# Patient Record
Sex: Female | Born: 1965 | Race: White | Hispanic: No | Marital: Married | Smoking: Never smoker
Health system: Southern US, Community
[De-identification: ages and names within clinical notes are randomized; demographics above are authoritative.]

## PROBLEM LIST (undated history)

## (undated) ENCOUNTER — Emergency Department (HOSPITAL_COMMUNITY): Payer: Medicare HMO | Source: Home / Self Care

## (undated) DIAGNOSIS — G4730 Sleep apnea, unspecified: Secondary | ICD-10-CM

## (undated) DIAGNOSIS — E538 Deficiency of other specified B group vitamins: Secondary | ICD-10-CM

## (undated) DIAGNOSIS — N62 Hypertrophy of breast: Secondary | ICD-10-CM

## (undated) DIAGNOSIS — M797 Fibromyalgia: Secondary | ICD-10-CM

## (undated) DIAGNOSIS — G8929 Other chronic pain: Secondary | ICD-10-CM

## (undated) DIAGNOSIS — R0902 Hypoxemia: Secondary | ICD-10-CM

## (undated) DIAGNOSIS — M67432 Ganglion, left wrist: Secondary | ICD-10-CM

## (undated) DIAGNOSIS — E1143 Type 2 diabetes mellitus with diabetic autonomic (poly)neuropathy: Secondary | ICD-10-CM

## (undated) DIAGNOSIS — H409 Unspecified glaucoma: Secondary | ICD-10-CM

## (undated) DIAGNOSIS — K3184 Gastroparesis: Secondary | ICD-10-CM

## (undated) DIAGNOSIS — E119 Type 2 diabetes mellitus without complications: Secondary | ICD-10-CM

## (undated) DIAGNOSIS — F32A Depression, unspecified: Secondary | ICD-10-CM

## (undated) DIAGNOSIS — R52 Pain, unspecified: Secondary | ICD-10-CM

## (undated) DIAGNOSIS — J45909 Unspecified asthma, uncomplicated: Secondary | ICD-10-CM

## (undated) DIAGNOSIS — K589 Irritable bowel syndrome without diarrhea: Secondary | ICD-10-CM

## (undated) DIAGNOSIS — G44309 Post-traumatic headache, unspecified, not intractable: Secondary | ICD-10-CM

## (undated) DIAGNOSIS — G43909 Migraine, unspecified, not intractable, without status migrainosus: Secondary | ICD-10-CM

## (undated) DIAGNOSIS — C4490 Unspecified malignant neoplasm of skin, unspecified: Secondary | ICD-10-CM

## (undated) DIAGNOSIS — I2510 Atherosclerotic heart disease of native coronary artery without angina pectoris: Secondary | ICD-10-CM

## (undated) DIAGNOSIS — S0990XS Unspecified injury of head, sequela: Secondary | ICD-10-CM

## (undated) DIAGNOSIS — J309 Allergic rhinitis, unspecified: Secondary | ICD-10-CM

## (undated) DIAGNOSIS — F329 Major depressive disorder, single episode, unspecified: Secondary | ICD-10-CM

## (undated) DIAGNOSIS — M7542 Impingement syndrome of left shoulder: Secondary | ICD-10-CM

## (undated) DIAGNOSIS — E785 Hyperlipidemia, unspecified: Secondary | ICD-10-CM

## (undated) DIAGNOSIS — M5136 Other intervertebral disc degeneration, lumbar region: Secondary | ICD-10-CM

## (undated) DIAGNOSIS — Z9889 Other specified postprocedural states: Secondary | ICD-10-CM

## (undated) DIAGNOSIS — N200 Calculus of kidney: Secondary | ICD-10-CM

## (undated) HISTORY — DX: Other intervertebral disc degeneration, lumbar region: M51.36

## (undated) HISTORY — DX: Unspecified asthma, uncomplicated: J45.909

## (undated) HISTORY — DX: Other specified postprocedural states: Z98.890

## (undated) HISTORY — DX: Impingement syndrome of left shoulder: M75.42

## (undated) HISTORY — PX: BREAST REDUCTION SURGERY: SHX8

## (undated) HISTORY — DX: Calculus of kidney: N20.0

## (undated) HISTORY — DX: Pain, unspecified: R52

## (undated) HISTORY — PX: ELBOW SURGERY: SHX618

## (undated) HISTORY — DX: Sleep apnea, unspecified: G47.30

## (undated) HISTORY — DX: Fibromyalgia: M79.7

## (undated) HISTORY — DX: Atherosclerotic heart disease of native coronary artery without angina pectoris: I25.10

## (undated) HISTORY — PX: GANGLION CYST EXCISION: SHX1691

## (undated) HISTORY — DX: Hypoxemia: R09.02

## (undated) HISTORY — DX: Deficiency of other specified B group vitamins: E53.8

## (undated) HISTORY — DX: Post-traumatic headache, unspecified, not intractable: S09.90XS

## (undated) HISTORY — PX: ORIF ANKLE FRACTURE: SUR919

## (undated) HISTORY — DX: Hypertrophy of breast: N62

## (undated) HISTORY — DX: Type 2 diabetes mellitus without complications: E11.9

## (undated) HISTORY — DX: Gastroparesis: E11.43

## (undated) HISTORY — DX: Irritable bowel syndrome without diarrhea: K58.9

## (undated) HISTORY — DX: Hyperlipidemia, unspecified: E78.5

## (undated) HISTORY — DX: Gastroparesis: K31.84

## (undated) HISTORY — DX: Unspecified malignant neoplasm of skin, unspecified: C44.90

## (undated) HISTORY — PX: ROTATOR CUFF REPAIR: SHX139

## (undated) HISTORY — DX: Allergic rhinitis, unspecified: J30.9

## (undated) HISTORY — PX: CHOLECYSTECTOMY: SHX55

## (undated) HISTORY — DX: Migraine, unspecified, not intractable, without status migrainosus: G43.909

## (undated) HISTORY — DX: Major depressive disorder, single episode, unspecified: F32.9

## (undated) HISTORY — DX: Unspecified glaucoma: H40.9

## (undated) HISTORY — DX: Post-traumatic headache, unspecified, not intractable: G44.309

## (undated) HISTORY — DX: Other chronic pain: G89.29

## (undated) HISTORY — DX: Depression, unspecified: F32.A

## (undated) HISTORY — DX: Ganglion, left wrist: M67.432

---

## 1998-04-24 ENCOUNTER — Ambulatory Visit (HOSPITAL_COMMUNITY): Admission: RE | Admit: 1998-04-24 | Discharge: 1998-04-24 | Payer: Self-pay | Admitting: Family Medicine

## 1999-08-07 ENCOUNTER — Ambulatory Visit (HOSPITAL_COMMUNITY): Admission: RE | Admit: 1999-08-07 | Discharge: 1999-08-07 | Payer: Self-pay | Admitting: Obstetrics and Gynecology

## 2000-03-15 ENCOUNTER — Inpatient Hospital Stay (HOSPITAL_COMMUNITY): Admission: RE | Admit: 2000-03-15 | Discharge: 2000-03-17 | Payer: Self-pay | Admitting: Obstetrics and Gynecology

## 2018-03-29 ENCOUNTER — Encounter: Payer: Self-pay | Admitting: Cardiology

## 2018-04-20 ENCOUNTER — Ambulatory Visit: Payer: Self-pay | Admitting: Cardiology

## 2018-05-25 ENCOUNTER — Encounter: Payer: Self-pay | Admitting: Cardiology

## 2018-05-25 ENCOUNTER — Encounter (INDEPENDENT_AMBULATORY_CARE_PROVIDER_SITE_OTHER): Payer: Self-pay

## 2018-05-25 ENCOUNTER — Ambulatory Visit: Payer: Medicare HMO | Admitting: Cardiology

## 2018-05-25 VITALS — BP 140/86 | HR 89 | Ht 60.0 in | Wt 182.0 lb

## 2018-05-25 DIAGNOSIS — K3184 Gastroparesis: Secondary | ICD-10-CM | POA: Diagnosis not present

## 2018-05-25 DIAGNOSIS — E785 Hyperlipidemia, unspecified: Secondary | ICD-10-CM

## 2018-05-25 DIAGNOSIS — R9431 Abnormal electrocardiogram [ECG] [EKG]: Secondary | ICD-10-CM

## 2018-05-25 DIAGNOSIS — E1169 Type 2 diabetes mellitus with other specified complication: Secondary | ICD-10-CM

## 2018-05-25 DIAGNOSIS — R0789 Other chest pain: Secondary | ICD-10-CM

## 2018-05-25 DIAGNOSIS — E1143 Type 2 diabetes mellitus with diabetic autonomic (poly)neuropathy: Secondary | ICD-10-CM

## 2018-05-25 DIAGNOSIS — R079 Chest pain, unspecified: Secondary | ICD-10-CM | POA: Diagnosis not present

## 2018-05-25 NOTE — Patient Instructions (Addendum)
Medication Instructions:  Your physician recommends that you continue on your current medications as directed. Please refer to the Current Medication list given to you today.   Labwork: none  Testing/Procedures: See instructions below.   Follow-Up: Your physician recommends that you schedule a follow-up appointment in: 3 months with Dr. Ellyn Hack.   Any Other Special Instructions Will Be Listed Below (If Applicable).  Please arrive at the [facility] main entrance of [facility] at ______________ (30-45 minutes prior to test start time)  [facility] [place], [place] [address] [phone]  Proceed to the [facility] Radiology Department (First Floor).  Please follow these instructions carefully (unless otherwise directed):  On the Night Before the Test: . Drink plenty of water. . Do not consume any caffeinated/decaffeinated beverages or chocolate 12 hours prior to your test. . Do not take any antihistamines 12 hours prior to your test. . If you take Metformin do not take 24 hours prior to test AND do not take 48 hours after complete the test.  On the Day of the Test: . Drink plenty of water. Do not drink any water within one hour of the test. . Do not eat any food 4 hours prior to the test. . You may take your regular medications prior to the test.  After the Test: . Drink plenty of water. . After receiving IV contrast, you may experience a mild flushed feeling. This is normal. . On occasion, you may experience a mild rash up to 24 hours after the test. This is not dangerous. If this occurs, you can take Benadryl 25 mg and increase your fluid intake. . If you experience trouble breathing, this can be serious. If it is severe call 911 IMMEDIATELY. If it is mild, please call our office. . If you take any of these medications: Glipizide/Metformin, Avandament, Glucavance, please do not take 48 hours after completing test.    If you need a  refill on your cardiac medications before your next appointment, please call your pharmacy.

## 2018-05-25 NOTE — Progress Notes (Signed)
PCP: Octavio Graves, DO  Clinic Note: Chief Complaint  Patient presents with  . New Patient (Initial Visit)    Chest pain, 2 episodes.  One took her to the emergency room    HPI: Brandy Cooke is a 52 y.o. female who is being seen today for the evaluation of CHEST PAIN at the request of  Octavio Graves, DO in response to an emergency room visit on April 29, 2018. Brandy Cooke has a long-standing history of diabetes mellitus, type II (with gastroparesis and neuropathy), hypertension, and hyperlipidemia.  She is not currently on any blood pressure medicines, and is only on low-dose Mevacor for hyperlipidemia with relatively good control. She has a history of reported cardiac arrest associated with intubation for surgery back in October 2016.  Brandy Cooke was last seen by an internal medicine physician back in January 2019.  Apparently she has been dealing with a lot of epigastric and GI issues probably related to gastroparesis.  She is however been evaluated with a PE protocol CT scan in January and June of this year.  There is evidence of coronary calcification noted on CT scan.  Recent Hospitalizations:  April 29, 2018: Presented with sharp epigastric abdominal pain apparently has been going on for several months.  She felt as it was a "tear in the right side of her abdomen ".  (To me she points more left-sided).  She said that the discomfort radiated to the substernal chest area.  She also noted alternating diarrhea and constipation is a chronic symptom.  Back in January she was told that she had a right-sided diaphragmatic hernia (was very scared because she was told that if it pushes against her heart it could cause sudden death).  On the day of presentation, she had been more active than usual. -->  She was diagnosed with epigastric pain related to constipation and pleurodynia.  Studies Personally Reviewed - (if available, images/films reviewed: From Epic Chart or Care Everywhere)  PE  protocol CT April 29, 2018: Right lung base atelectasis versus scarring).  No acute thoracic aortic abnormalities.  (From January study  -atherosclerosis of multiple arterial structures including coronary arteries)  Interval History: Brandy Cooke presents here today the notably in if not physical distress and emotional distress.  She is very tearful and concerned.  She tells me she has had 2 episodes of chest pain that are concerning for her. 1. While riding in her car she had an episode of 35 minutes of pain (couple weeks ago).  She went to the fire department and was told she had normal EKG and her symptoms improved somewhat so she did not go to emergency room. 2. She was at the store near her house talking to friends and she started having sharp more epigastric type pain radiating up into her chest.  That is what led to her going to the emergency room and having the evaluation noted above. She tells me that the chest pain episodes have happened with both rest or with doing mild exertion.  Not with fast walking.  She has had several episodes of feeling dizzy but cannot really describe to me symptoms that go along with it.  She really cannot tell me if she feels any irregular heartbeats or flip-flopping skipping beats.  She thinks she might but not all the time. As far as the chest pain episodes that she has been having now they do not necessarily seem to be exertional although taking a deep breath and her  coughing makes them worse.  She points to her epigastric area and saying that it goes up into her chest.  She also has diffuse abdominal discomfort.  Very hard to get any good story from her.  She says that she walks her dog daily and does not have worsening of this chest discomfort walking the dog.  May affect she may always have a little bit of discomfort in her chest but the symptoms that she had noted above were different for her.  With her walking, she denies any chest discomfort.  She sometimes gets short  of breath with exercise, but not routinely.  She does not really do much exercise, besides walking her her dog around the neighborhood.  no real complaints of PND, orthopnea or edema.  She feels fluttering but no rapid irregular heartbeats or palpitations.  No syncope/near syncope or TIA/amaurosis fugax.  No claudication.  ROS: A comprehensive was performed and to the extent was performed was positive. Review of Systems  Constitutional: Positive for chills and malaise/fatigue. Negative for diaphoresis.  HENT: Negative for congestion, nosebleeds and sore throat.   Eyes: Positive for blurred vision.  Respiratory: Positive for cough and shortness of breath. Negative for wheezing.   Cardiovascular: Negative for leg swelling (Minimal).  Gastrointestinal: Positive for abdominal pain, constipation, diarrhea, heartburn and nausea. Negative for blood in stool and melena.  Genitourinary: Negative for hematuria.  Musculoskeletal: Positive for back pain, joint pain (Knees and hips) and myalgias (Fibromyalgia). Negative for falls.  Neurological: Positive for dizziness, weakness (Global) and headaches.  Psychiatric/Behavioral: Positive for depression (Supposedly under control). The patient is nervous/anxious.   All other systems reviewed and are negative.    I have reviewed and (if needed) personally updated the patient's problem list, medications, allergies, past medical and surgical history, social and family history.   Past Medical History:  Diagnosis Date  . Allergic rhinitis   . Asthma   . Chronic depression    Dr. Annette Stable Psychiatry     . Chronic pain of multiple sites   . DDD (degenerative disc disease), lumbar   . Diabetic gastroparesis associated with type 2 diabetes mellitus ([place])   . Dyslipidemia    Well-controlled with low-dose Mevacor  . Fibromyalgia   . Ganglion cyst of dorsum of left wrist   . Glaucoma   . Headaches due to old head injury   . IBS (irritable bowel syndrome)   .  Impingement syndrome of left shoulder   . Macromastia   . Migraine   . Nephrolithiasis   . Postoperative hypoxia   . Skin cancer   . Sleep apnea   . Type 2 diabetes mellitus without complication, without long-term current use of insulin ([place])   . Vitamin B12 deficiency   --Gastroparesis from diabetes  Past Surgical History:  Procedure Laterality Date  . BREAST REDUCTION SURGERY    . CHOLECYSTECTOMY    . ELBOW SURGERY     X4  . GANGLION CYST EXCISION Left    WRIST  . ORIF ANKLE FRACTURE Right   . ROTATOR CUFF REPAIR     RIGHT AND LEFT    Current Meds  Medication Sig  . acyclovir (ZOVIRAX) 400 MG tablet Take 400 mg by mouth 2 (two) times daily.  Marland Kitchen ALPRAZolam (XANAX) 1 MG tablet Take 1 mg by mouth daily. TAKE 1 TABLET IN AM AND 2 TABLETS AT BEDTIME  . cyclobenzaprine (FLEXERIL) 10 MG tablet Take 10 mg by mouth 2 (two) times daily as needed for  muscle spasms.  Marland Kitchen glimepiride (AMARYL) 2 MG tablet Take 2 mg by mouth 2 (two) times daily. WITH BREAKFAST OR THE FIRST MEAL OF THE DAY  . hydrochlorothiazide (HYDRODIURIL) 12.5 MG tablet Take 12.5 mg by mouth daily.  . hyoscyamine (LEVSIN, ANASPAZ) 0.125 MG tablet Take 0.125 mg by mouth every 4 (four) hours as needed.  . lovastatin (MEVACOR) 20 MG tablet Take 20 mg by mouth daily at 6 PM. WITH EVENING MEAL  . metFORMIN (GLUMETZA) 1000 MG (MOD) 24 hr tablet Take 1,000 mg by mouth 2 (two) times daily with a meal. EVENING  . omeprazole (PRILOSEC) 40 MG capsule Take 40 mg by mouth 2 (two) times daily.  . Probiotic Product (PROBIOTIC DAILY) CAPS Take by mouth.  . traZODone (DESYREL) 100 MG tablet Take 200 mg by mouth at bedtime.  . triamcinolone cream (KENALOG) 0.1 % Apply 1 application topically 2 (two) times daily.    Allergies  Allergen Reactions  . Depakote [Divalproex Sodium]   . Gabapentin   . Penicillins   . Poison [employer]   . Sulfa Antibiotics     Social History   Tobacco Use  . Smoking status: Never Smoker  . Smokeless  tobacco: Never Used  Substance Use Topics  . Alcohol use: Never    Frequency: Never  . Drug use: Never   Social History   Social History Narrative  . Not on file    family history includes Diabetes Mellitus I in her sister; Diabetes Mellitus II (age of onset: 18) in her father.  Wt Readings from Last 3 Encounters:  05/25/18 182 lb (82.6 kg)    PHYSICAL EXAM BP 140/86 (BP Location: Left Arm, Patient Position: Supine, Cuff Size: Normal)   Pulse 89   Ht 5' (1.524 m)   Wt 182 lb (82.6 kg)   BMI 35.54 kg/m  Physical Exam  Constitutional: She is oriented to person, place, and time. She appears well-developed and well-nourished. She appears distressed (More emotional than physical).  Well-nourished well-groomed.  Moderately obese.  Seems to be in emotional distress more so than anything else.  Nontoxic.  HENT:  Head: Normocephalic and atraumatic.  Mouth/Throat: No oropharyngeal exudate.  Tearful  Eyes: Pupils are equal, round, and reactive to light. Conjunctivae and EOM are normal. No scleral icterus.  Neck: Normal range of motion. Neck supple. No hepatojugular reflux and no JVD present. Carotid bruit is not present.  Cardiovascular: Normal rate, regular rhythm, intact distal pulses and normal pulses.  Extrasystoles are present. PMI is not displaced (Unable to palpate). Exam reveals distant heart sounds. Exam reveals no gallop and no friction rub.  No murmur heard. Pulmonary/Chest: No respiratory distress. She has no wheezes. She has no rales. She exhibits tenderness (Tenderness along the lower sternal border into the epigastrium.).  Diminished basal breath sounds.  Possibly due to poor effort.  Abdominal: She exhibits distension. She exhibits no mass. There is tenderness. There is guarding.  Difficult to tell if there is true distention or just fullness.  Does appear to be mildly distended and tympanitic.  Exquisite tenderness in the epigastric region radiating from right upper  quadrant to left upper quadrant.  No positive Murphy or McBurney sign.  No rebound. She was in tears when I was examining her abdomen  Musculoskeletal: Normal range of motion. She exhibits edema (Trivial bilateral pedal).  Lymphadenopathy:    She has no cervical adenopathy.  Neurological: She is alert and oriented to person, place, and time. No cranial nerve deficit.  Coordination normal.  Skin: Skin is warm and dry.  Psychiatric: Her speech is normal. Thought content normal. Her mood appears anxious. Her affect is labile. She is slowed. She exhibits a depressed mood. She exhibits abnormal recent memory.  Very labile, tearful especially after physical exam complete.  Vitals reviewed.   Adult ECG Report  Rate: 89;  Rhythm: normal sinus rhythm and Cannot exclude inferior and anterior MI age undetermined.  Normal axis, intervals and durations.;   Narrative Interpretation: Besides poor R wave progression and borderline cues in inferior leads, normal.   Other studies Reviewed: Additional studies/ records that were reviewed today include:  Recent Labs: From Care Everywhere  January 2019 A1c 7.9  April 29, 2018: CBC-W 10.2, H/H 12.5/39.5, platelet 259. Na+ 137, K+ 3.7, Cl- 98, HCO3- 26, BUN 8, Cr 0.6, Glu 91, Ca++ 8.9; AST  17, ALT 22, AlkP 91  February 28, 2018: TSH 2.3, free T4 1.04.  August 30, 2017: TC 159, TG 324, HDL 29, LDL 65   ASSESSMENT / PLAN: Problem List Items Addressed This Visit    Hyperlipidemia associated with type 2 diabetes mellitus ([place])    She is on low-dose lovastatin (we can stop statins) with relatively well-controlled lipid levels.  Triglycerides are little elevated, but otherwise LDL and total cholesterol look good.  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  She seems to be confused as well is being done and seems to perseverate on the abdominal symptoms..  I suspect that a lot of the abdominal pain and epigastric pain she is feeling is still related to this.  However we will exclude CAD since she has diabetes.      Relevant Orders   EKG 12-Lead (Completed)   CT CORONARY MORPH W/CTA COR W/SCORE W/CA W/CM &/OR WO/CM   CT CORONARY FRACTIONAL FLOW RESERVE DATA PREP   CT CORONARY FRACTIONAL FLOW RESERVE FLUID ANALYSIS   Basic metabolic panel   Chest wall pain    She definitely has palpable pain in her epigastric area as well as along the rib margins.  She actually was tearful when I palpated.  My suspicion is that she is having some musculoskeletal symptoms of costochondritis and that may be what was leading to her symptoms noted in HPI.  We will exclude ischemic CAD first.      Relevant Orders   EKG 12-Lead (Completed)   CT CORONARY MORPH W/CTA COR W/SCORE W/CA W/CM &/OR WO/CM   CT CORONARY FRACTIONAL FLOW RESERVE DATA PREP   CT CORONARY FRACTIONAL FLOW RESERVE FLUID ANALYSIS   Basic metabolic panel   Chest pain with moderate risk for cardiac etiology - Primary    She is a diabetic with low HDL and borderline hypertension along with obesity.  She would therefore have metabolic syndrome which makes her high risk for CAD.  Plan: I do not think she can walk on a treadmill and therefore I do not think a treadmill GXT would be reasonable.  I am leery of doing a Myoview stress test because  of gastroparesis leading to a large gastric bubble and therefore splanchnic attenuation. Best course of action is coronary calcium score with coronary CT angiogram which allows [place] to evaluate the presence of CAD but then also physiologic evaluation of  potential lesions.  Plan: Coronary Calcium Score with CTA (, will need pre-CTA labs)      Relevant Orders   EKG 12-Lead (Completed)   CT CORONARY MORPH W/CTA COR W/SCORE W/CA W/CM &/OR WO/CM   CT CORONARY FRACTIONAL FLOW RESERVE DATA PREP   CT CORONARY FRACTIONAL FLOW RESERVE FLUID ANALYSIS   Basic metabolic panel   Abnormal EKG    EKG has suggestion of both inferior and anterior MI.  She has no reaction of being ever told she had any issues with her heart.  Would be nice to have an ischemic evaluation or recent echocardiogram to review.  We will start with coronary CT angiogram to evaluate for ischemia.  Next step would then be to consider echocardiogram.      Relevant Orders   EKG 12-Lead (Completed)   CT CORONARY MORPH W/CTA COR W/SCORE W/CA W/CM &/OR WO/CM   CT CORONARY FRACTIONAL FLOW RESERVE DATA PREP   CT CORONARY FRACTIONAL FLOW RESERVE FLUID ANALYSIS   Basic metabolic panel       I spent a total of 44minutes with the patient and chart review. >  50% of the time was spent in direct patient consultation.   Current medicines are reviewed at length with the patient today.  (+/- concerns) not sure what to about her Sx. The following changes have been made:  see below, no med changes pending OP evaluation.  Patient Instructions  Medication Instructions:  Your physician recommends that you continue on your current medications as directed. Please refer to the Current Medication list given to you today.   Labwork: none  Testing/Procedures: See instructions below.   Follow-Up: Your physician recommends that you schedule a follow-up appointment in: 3 months with Dr. Ellyn Hack.    Studies Ordered:   Orders Placed This Encounter  Procedures  . CT CORONARY MORPH W/CTA COR W/SCORE W/CA W/CM &/OR WO/CM  . CT CORONARY FRACTIONAL FLOW RESERVE DATA PREP  . CT CORONARY FRACTIONAL FLOW RESERVE FLUID ANALYSIS  . Basic metabolic panel  . EKG 12-Lead      Glenetta Hew,  M.D., M.S. Interventional Cardiologist   Pager # [phone] Phone # [phone] [address] [place], [place] [address]   Thank you for choosing [facility] at [facility]!!

## 2018-05-27 ENCOUNTER — Encounter: Payer: Self-pay | Admitting: Cardiology

## 2018-05-27 DIAGNOSIS — K3184 Gastroparesis: Secondary | ICD-10-CM

## 2018-05-27 DIAGNOSIS — E1143 Type 2 diabetes mellitus with diabetic autonomic (poly)neuropathy: Secondary | ICD-10-CM | POA: Insufficient documentation

## 2018-05-27 DIAGNOSIS — R0789 Other chest pain: Secondary | ICD-10-CM | POA: Insufficient documentation

## 2018-05-27 DIAGNOSIS — I2510 Atherosclerotic heart disease of native coronary artery without angina pectoris: Secondary | ICD-10-CM | POA: Insufficient documentation

## 2018-05-27 DIAGNOSIS — R9431 Abnormal electrocardiogram [ECG] [EKG]: Secondary | ICD-10-CM | POA: Insufficient documentation

## 2018-05-27 DIAGNOSIS — E785 Hyperlipidemia, unspecified: Secondary | ICD-10-CM

## 2018-05-27 DIAGNOSIS — E1169 Type 2 diabetes mellitus with other specified complication: Secondary | ICD-10-CM | POA: Insufficient documentation

## 2018-05-27 NOTE — Assessment & Plan Note (Signed)
She definitely has palpable pain in her epigastric area as well as along the rib margins.  She actually was tearful when I palpated.  My suspicion is that she is having some musculoskeletal symptoms of costochondritis and that may be what was leading to her symptoms noted in HPI.  We will exclude ischemic CAD first.

## 2018-05-27 NOTE — Assessment & Plan Note (Signed)
She is on low-dose lovastatin (we can stop statins) with relatively well-controlled lipid levels.  Triglycerides are little elevated, but otherwise LDL and total cholesterol look good.  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

## 2018-05-27 NOTE — Assessment & Plan Note (Addendum)
EKG has suggestion of both inferior and anterior MI.  She has no reaction of being ever told she had any issues with her heart.  Would be nice to have an ischemic evaluation or recent echocardiogram to review.  We will start with coronary CT angiogram to evaluate for ischemia.  Next step would then be to consider echocardiogram.

## 2018-05-27 NOTE — Assessment & Plan Note (Signed)
She is a diabetic with low HDL and borderline hypertension along with obesity.  She would therefore have metabolic syndrome which makes her high risk for CAD.  Plan: I do not think she can walk on a treadmill and therefore I do not think a treadmill GXT would be reasonable.  I am leery of doing a Myoview stress test because of gastroparesis leading to a large gastric bubble and therefore splanchnic attenuation. Best course of action is coronary calcium score with coronary CT angiogram which allows [place] to evaluate the presence of CAD but then also physiologic evaluation of potential lesions.  Plan: Coronary Calcium Score with CTA (, will need pre-CTA labs)

## 2018-05-27 NOTE — Assessment & Plan Note (Signed)
This seems to be more the major driving forces for a lot of her abdominal and epigastric symptoms.  She seems to be confused as well is being done and seems to perseverate on the abdominal symptoms..  I suspect that a lot of the abdominal pain and epigastric pain she is feeling is still related to this.  However we will exclude CAD since she has diabetes.

## 2018-06-09 DIAGNOSIS — I2510 Atherosclerotic heart disease of native coronary artery without angina pectoris: Secondary | ICD-10-CM

## 2018-06-09 HISTORY — DX: Atherosclerotic heart disease of native coronary artery without angina pectoris: I25.10

## 2018-06-15 LAB — BASIC METABOLIC PANEL
BUN/Creatinine Ratio: 18 (ref 9–23)
BUN: 12 mg/dL (ref 6–24)
CALCIUM: 9.2 mg/dL (ref 8.7–10.2)
CHLORIDE: 98 mmol/L (ref 96–106)
CO2: 27 mmol/L (ref 20–29)
Creatinine, Ser: 0.67 mg/dL (ref 0.57–1.00)
GFR calc non Af Amer: 103 mL/min/{1.73_m2} (ref >59)
GFR, EST AFRICAN AMERICAN: 119 mL/min/{1.73_m2} (ref >59)
GLUCOSE: 106 mg/dL — AB (ref 65–99)
POTASSIUM: 4.1 mmol/L (ref 3.5–5.2)
Sodium: 140 mmol/L (ref 134–144)

## 2018-06-22 ENCOUNTER — Ambulatory Visit (HOSPITAL_COMMUNITY)
Admission: RE | Admit: 2018-06-22 | Discharge: 2018-06-22 | Disposition: A | Discharge status: Home / Self Care | Payer: Medicare HMO | Source: Ambulatory Visit | Attending: Cardiology | Admitting: Cardiology

## 2018-06-22 ENCOUNTER — Other Ambulatory Visit: Payer: Self-pay

## 2018-06-22 ENCOUNTER — Emergency Department (HOSPITAL_COMMUNITY)
Admission: EM | Admit: 2018-06-22 | Discharge: 2018-06-22 | Disposition: A | Discharge status: Home / Self Care | Payer: Medicare HMO | Attending: Emergency Medicine | Admitting: Emergency Medicine

## 2018-06-22 ENCOUNTER — Emergency Department (HOSPITAL_COMMUNITY): Payer: Medicare HMO

## 2018-06-22 ENCOUNTER — Encounter (HOSPITAL_COMMUNITY): Payer: Self-pay

## 2018-06-22 DIAGNOSIS — E785 Hyperlipidemia, unspecified: Secondary | ICD-10-CM

## 2018-06-22 DIAGNOSIS — E1143 Type 2 diabetes mellitus with diabetic autonomic (poly)neuropathy: Secondary | ICD-10-CM | POA: Insufficient documentation

## 2018-06-22 DIAGNOSIS — K3184 Gastroparesis: Secondary | ICD-10-CM

## 2018-06-22 DIAGNOSIS — E1169 Type 2 diabetes mellitus with other specified complication: Secondary | ICD-10-CM | POA: Insufficient documentation

## 2018-06-22 DIAGNOSIS — R0789 Other chest pain: Secondary | ICD-10-CM

## 2018-06-22 DIAGNOSIS — R9431 Abnormal electrocardiogram [ECG] [EKG]: Secondary | ICD-10-CM

## 2018-06-22 DIAGNOSIS — R55 Syncope and collapse: Secondary | ICD-10-CM

## 2018-06-22 DIAGNOSIS — I2510 Atherosclerotic heart disease of native coronary artery without angina pectoris: Secondary | ICD-10-CM

## 2018-06-22 DIAGNOSIS — K760 Fatty (change of) liver, not elsewhere classified: Secondary | ICD-10-CM | POA: Insufficient documentation

## 2018-06-22 DIAGNOSIS — J45909 Unspecified asthma, uncomplicated: Secondary | ICD-10-CM | POA: Insufficient documentation

## 2018-06-22 DIAGNOSIS — R072 Precordial pain: Secondary | ICD-10-CM | POA: Diagnosis not present

## 2018-06-22 DIAGNOSIS — R079 Chest pain, unspecified: Secondary | ICD-10-CM

## 2018-06-22 LAB — I-STAT BETA HCG BLOOD, ED (MC, WL, AP ONLY): I-stat hCG, quantitative: <5 m[IU]/mL (ref <5)

## 2018-06-22 LAB — CBC
HEMATOCRIT: 39.2 % (ref 36.0–46.0)
HEMOGLOBIN: 12.3 g/dL (ref 12.0–15.0)
MCH: 28.8 pg (ref 26.0–34.0)
MCHC: 31.4 g/dL (ref 30.0–36.0)
MCV: 91.8 fL (ref 78.0–100.0)
Platelets: 265 10*3/uL (ref 150–400)
RBC: 4.27 MIL/uL (ref 3.87–5.11)
RDW: 13.4 % (ref 11.5–15.5)
WBC: 11 10*3/uL — ABNORMAL HIGH (ref 4.0–10.5)

## 2018-06-22 LAB — BASIC METABOLIC PANEL
Anion gap: 8 (ref 5–15)
BUN: 8 mg/dL (ref 6–20)
CO2: 28 mmol/L (ref 22–32)
Calcium: 8.5 mg/dL — ABNORMAL LOW (ref 8.9–10.3)
Chloride: 101 mmol/L (ref 98–111)
Creatinine, Ser: 0.64 mg/dL (ref 0.44–1.00)
GFR calc Af Amer: >60 mL/min (ref >60)
GFR calc non Af Amer: >60 mL/min (ref >60)
GLUCOSE: 145 mg/dL — AB (ref 70–99)
POTASSIUM: 3.7 mmol/L (ref 3.5–5.1)
Sodium: 137 mmol/L (ref 135–145)

## 2018-06-22 LAB — I-STAT TROPONIN, ED: Troponin i, poc: 0 ng/mL (ref 0.00–0.08)

## 2018-06-22 MED ORDER — METOPROLOL TARTRATE 5 MG/5ML IV SOLN
10.0000 mg | INTRAVENOUS | Status: AC | PRN
  Administered 2018-06-22 (×2): 10 mg via INTRAVENOUS
  Filled 2018-06-22 (×2): qty 10

## 2018-06-22 MED ORDER — NITROGLYCERIN 0.4 MG SL SUBL
SUBLINGUAL_TABLET | SUBLINGUAL | Status: AC
  Filled 2018-06-22: qty 2

## 2018-06-22 MED ORDER — NITROGLYCERIN 0.4 MG SL SUBL
0.8000 mg | SUBLINGUAL_TABLET | Freq: Once | SUBLINGUAL | Status: AC
  Administered 2018-06-22: 0.8 mg via SUBLINGUAL
  Filled 2018-06-22: qty 25

## 2018-06-22 MED ORDER — METOPROLOL TARTRATE 5 MG/5ML IV SOLN
INTRAVENOUS | Status: AC
  Filled 2018-06-22: qty 20

## 2018-06-22 MED ORDER — IOPAMIDOL (ISOVUE-370) INJECTION 76%
100.0000 mL | Freq: Once | INTRAVENOUS | Status: AC | PRN
  Administered 2018-06-22: 90 mL via INTRAVENOUS

## 2018-06-22 MED ORDER — LACTATED RINGERS IV SOLN
INTRAVENOUS | Status: DC
  Administered 2018-06-22: 18:00:00 via INTRAVENOUS

## 2018-06-22 MED ORDER — SODIUM CHLORIDE 0.9 % IV BOLUS
500.0000 mL | Freq: Once | INTRAVENOUS | Status: AC
  Administered 2018-06-22: 500 mL via INTRAVENOUS

## 2018-06-22 NOTE — Progress Notes (Signed)
Patient transported to ED triage via wheelchair by this RN. Report given to triage RN.

## 2018-06-22 NOTE — ED Provider Notes (Signed)
Emergency Department Provider Note   I have reviewed the triage vital signs and the nursing notes.   HISTORY  Chief Complaint Dizziness   HPI Brandy Cooke is a 52 y.o. female with PMH of DM with gastroparesis, fibromyalgia, and IBS since to the emergency department for evaluation of lightheadedness while having a CT scan performed today.  The patient has had chronic left chest and shoulder pain for the past several weeks to months.  She has been followed by gastroenterology at [facility] and recently established care with a new PCP.  She was referred to cardiology and saw Dr. Ellyn Hack on 7/19.  She was scheduled for an outpatient coronary CT which she was having performed today.  Patient had been given 0.8 of nitro and 20 mg of metoprolol.  After leaving the scan the patient felt lightheadedness and was found to have positive orthostatic vital signs.  She was given 1 L IV fluid and vital signs improved but elected to come to the emergency department for evaluation.  Continues to have her baseline chest pain.  She denies any heart palpitations or shortness of breath.  Feeling lightheaded now but states this is been ongoing for many weeks to months.    Past Medical History:  Diagnosis Date  . Allergic rhinitis   . Asthma   . Chronic depression    Dr. Annette Stable Psychiatry     . Chronic pain of multiple sites   . DDD (degenerative disc disease), lumbar   . Diabetic gastroparesis associated with type 2 diabetes mellitus ([place])   . Dyslipidemia    Well-controlled with low-dose Mevacor  . Fibromyalgia   . Ganglion cyst of dorsum of left wrist   . Glaucoma   . Headaches due to old head injury   . IBS (irritable bowel syndrome)   . Impingement syndrome of left shoulder   . Macromastia   . Migraine   . Nephrolithiasis   . Postoperative hypoxia   . Skin cancer   . Sleep apnea   . Type 2 diabetes mellitus without complication, without long-term current use of insulin ([place])   . Vitamin  B12 deficiency     Patient Active Problem List   Diagnosis Date Noted  . Chest pain with moderate risk for cardiac etiology 05/27/2018  . Chest wall pain 05/27/2018  . Abnormal EKG 05/27/2018  . Diabetic gastroparesis associated with type 2 diabetes mellitus ([place]) 05/27/2018  . Hyperlipidemia associated with type 2 diabetes mellitus ([place]) 05/27/2018    Past Surgical History:  Procedure Laterality Date  . BREAST REDUCTION SURGERY    . CHOLECYSTECTOMY    . ELBOW SURGERY     X4  . GANGLION CYST EXCISION Left    WRIST  . ORIF ANKLE FRACTURE Right   . ROTATOR CUFF REPAIR     RIGHT AND LEFT    Allergies Depakote [divalproex sodium]; Gabapentin; Penicillins; Sulfa antibiotics; and Poison oak extract  Family History  Problem Relation Age of Onset  . Diabetes Mellitus II Father 34  . Diabetes Mellitus I Sister     Social History Social History   Tobacco Use  . Smoking status: Never Smoker  . Smokeless tobacco: Never Used  Substance Use Topics  . Alcohol use: Never    Frequency: Never  . Drug use: Never    Review of Systems  Constitutional: No fever/chills Eyes: No visual changes. ENT: No sore throat. Cardiovascular: Denies chest pain. Positive lightheadedness.  Respiratory: Denies shortness of breath. Gastrointestinal: No  abdominal pain.  No nausea, no vomiting.  No diarrhea.  No constipation. Genitourinary: Negative for dysuria. Musculoskeletal: Negative for back pain. Skin: Negative for rash. Neurological: Negative for headaches, focal weakness or numbness.  10-point ROS otherwise negative.  ____________________________________________   PHYSICAL EXAM:  VITAL SIGNS: ED Triage Vitals  Enc Vitals Group     BP 06/22/18 1554 119/82     Pulse Rate 06/22/18 1554 67     Resp 06/22/18 1554 17     Temp 06/22/18 1554 97.7 F (36.5 C)     Temp Source 06/22/18 1554 Oral     SpO2 06/22/18 1554 99 %     Weight 06/22/18 1554 181 lb (82.1 kg)     Height 06/22/18  1554 5' (1.524 m)     Pain Score 06/22/18 1609 0   Constitutional: Alert and oriented. Well appearing and in no acute distress. Eyes: Conjunctivae are normal. PERRL. EOMI. Head: Atraumatic. Nose: No congestion/rhinnorhea. Mouth/Throat: Mucous membranes are moist. Neck: No stridor.   Cardiovascular: Normal rate, regular rhythm. Good peripheral circulation. Grossly normal heart sounds.   Respiratory: Normal respiratory effort.  No retractions. Lungs CTAB. Gastrointestinal: Soft and nontender. No distention.  Musculoskeletal: No lower extremity tenderness nor edema. No gross deformities of extremities. Neurologic:  Normal speech and language. No gross focal neurologic deficits are appreciated. Normal CN exam 2-12. Normal finger-to-nose testing.  Skin:  Skin is warm, dry and intact. No rash noted.  ____________________________________________   LABS (all labs ordered are listed, but only abnormal results are displayed)  Labs Reviewed  BASIC METABOLIC PANEL - Abnormal; Notable for the following components:      Result Value   Glucose, Bld 145 (*)    Calcium 8.5 (*)    All other components within normal limits  CBC - Abnormal; Notable for the following components:   WBC 11.0 (*)    All other components within normal limits  I-STAT TROPONIN, ED  I-STAT BETA HCG BLOOD, ED ([facility], [facility], [facility] ONLY)  I-STAT TROPONIN, ED   ____________________________________________  EKG   EKG Interpretation  Date/Time:  Wednesday June 22 2018 16:24:23 EDT Ventricular Rate:  67 PR Interval:  128 QRS Duration: 74 QT Interval:  410 QTC Calculation: 433 R Axis:   7 Text Interpretation:  Normal sinus rhythm Anterior infarct , age undetermined Abnormal ECG No STEMI  Confirmed by Nanda Quinton [phone]) on 06/22/2018 6:14:42 PM       ____________________________________________  RADIOLOGY  Dg Chest 2 View  Result Date: 06/22/2018 CLINICAL DATA:  Dizziness.  Chest pain. EXAM: CHEST - 2 VIEW COMPARISON:   Coronary CT angiogram 06/22/2018. FINDINGS: Heart size and vascularity are normal. Lungs are clear. No effusions. No significant bone abnormality. Minimal elevation of the right hemidiaphragm. IMPRESSION: No significant abnormalities. Electronically Signed   By: Lorriane Shire M.D.   On: 06/22/2018 16:58   Ct Coronary Morph W/cta Cor W/score W/ca W/cm &/or Wo/cm  Addendum Date: 06/22/2018   ADDENDUM REPORT: 06/22/2018 17:00 CLINICAL DATA:  52 year old female with DM, hyperlipidemia, chest pain and abnormal ECG. EXAM: Cardiac/Coronary  CT TECHNIQUE: The patient was scanned on a [employer]. FINDINGS: A 120 kV prospective scan was triggered in the descending thoracic aorta at 111 HU's. Axial non-contrast 3 mm slices were carried out through the heart. The data set was analyzed on a dedicated work station and scored using the [place]. Gantry rotation speed was 250 msecs and collimation was .6 mm. No beta blockade and 0.8 mg of  sl NTG was given. The 3D data set was reconstructed in 5% intervals of the 67-82 % of the R-R cycle. Diastolic phases were analyzed on a dedicated work station using MPR, MIP and VRT modes. The patient received 80 cc of contrast. Aorta:  Normal size.  Mild calcifications.  No dissection. Aortic Valve: Trileaflet.  Trivial calcifications. Coronary Arteries:  Normal coronary origin.  Right dominance. RCA is a small lumen dominant artery that gives rise to small PDA and PLVB. There is mild calcified plaque in the proximal RCA with associated stenosis 25-50%. Left main is a large artery that gives rise to LAD and LCX arteries. Left main has no stenosis. LAD is a large vessel that gives rise to one diagonal artery. There is moderate calcified plaque in the mid LAD with a focal stenosis 50-69%. D1 has no significant plaque. LCX is a non-dominant artery that gives rise to two OM branches. There is mild calcified plaque in the proximal segment with stenosis 25-50%. Other findings:  Normal pulmonary vein drainage into the left atrium. Normal let atrial appendage without a thrombus. Normal size of the pulmonary artery. IMPRESSION: 1. Coronary calcium score of 210. This was 76 percentile for age and sex matched control. 2. Normal coronary origin with right dominance. 3. Mild to moderate diffuse CAD. CT FFR will be submitted for further evaluation. Electronically Signed   By: Ena Dawley   On: 06/22/2018 17:00   Result Date: 06/22/2018 EXAM: OVER-READ INTERPRETATION  CT CHEST The following report is an over-read performed by radiologist Dr. Abigail Miyamoto of [facility], [place] on 06/22/2018. This over-read does not include interpretation of cardiac or coronary anatomy or pathology. The coronary CTA interpretation by the cardiologist is attached. COMPARISON:  None. FINDINGS: Vascular: Normal aortic caliber, without dissection. No imaged pulmonary embolism, on this nondedicated study. Mediastinum/Nodes: No imaged thoracic adenopathy. Lungs/Pleura: Right hemidiaphragm elevation. No pleural fluid. Right lower lobe volume loss and subsegmental atelectasis or scar. Upper Abdomen: Moderate hepatic steatosis. Normal imaged portions of the spleen, stomach. Musculoskeletal: No acute osseous abnormality. IMPRESSION: 1.  No acute findings in the imaged extracardiac chest. 2. Right hemidiaphragm elevation. 3. Hepatic steatosis. Electronically Signed: By: Abigail Miyamoto M.D. On: 06/22/2018 16:19    ____________________________________________   PROCEDURES  Procedure(s) performed:   Procedures  None ____________________________________________   INITIAL IMPRESSION / ASSESSMENT AND PLAN / ED COURSE  Pertinent labs & imaging results that were available during my care of the patient were reviewed by me and considered in my medical decision making (see chart for details).  Patient presents to the ED with lightheadedness in after cardiac CT. She was given nitro and metoprolol during the  procedure. IVF given prior to arrival in the ED. Chest pain is chronic and atypical for ischemic pain. Patient labs including serial troponin is negative. Plan for GI and Cardiology follow up to discuss Coronary CT.   At this time, I do not feel there is any life-threatening condition present. I have reviewed and discussed all results (EKG, imaging, lab, urine as appropriate), exam findings with patient. I have reviewed nursing notes and appropriate previous records.  I feel the patient is safe to be discharged home without further emergent workup. Discussed usual and customary return precautions. Patient and family (if present) verbalize understanding and are comfortable with this plan.  Patient will follow-up with their primary care provider. If they do not have a primary care provider, information for follow-up has been provided to them. All questions have been  answered.   ____________________________________________  FINAL CLINICAL IMPRESSION(S) / ED DIAGNOSES  Final diagnoses:  Precordial chest pain  Near syncope    Note:  This document was prepared using Dragon voice recognition software and may include unintentional dictation errors.  Nanda Quinton, MD Emergency Medicine    Long, Wonda Olds, MD 06/23/18 [phone]

## 2018-06-22 NOTE — ED Triage Notes (Signed)
Pt coming in from outpatient CT heart for chest pain. Pt given 20 metoprolol and 0.8 nitro at 1330, on the way out the door after scan pt reports she felt dizzy and lightheaded. Pt was orthostatic, received 1 bolus. Afterwards BP 125/88, 65 P standing. Pt still felt  Lightheaded and dizzy and wanted to come here.

## 2018-06-22 NOTE — ED Triage Notes (Signed)
Pt states that she has been having trouble with her diaphragm and her intestines swelling. Pt reports history of dizzy spells, reports that no one has been able to figure out why she is having them.

## 2018-06-22 NOTE — Progress Notes (Signed)
Patient complaining of feeling dizzy and light headed. Upon exam patient noted to be orthostatic. MD Meda Coffee called and one liter bolus ordered. After completion of bolus, orthostatic vitals rechecked. Patient BP and heart rate no longer orthostatic but patient still feeling dizzy and lightheaded. This RN will escort patient to ED to be checked into triage and give report.

## 2018-06-22 NOTE — ED Provider Notes (Signed)
Patient placed in Quick Look pathway, seen and evaluated   Chief Complaint: dizziness and chest pain  HPI:   Swayzie Choate is a 52 y.o. female who presents to the ED with c/o dizziness, chest pain and upper abdominal pain. Patient was here today getting CT scan and when she stood up she got dizzy so they brought her to the ED.   ROS: Neuro: dizziness  Physical Exam:  BP 119/82 (BP Location: Left Arm)   Pulse 67   Temp 97.7 F (36.5 C) (Oral)   Resp 17   Ht 5' (1.524 m)   Wt 82.1 kg   SpO2 99%   BMI 35.35 kg/m    Gen: No distress  Neuro: Awake and Alert  Skin: Warm and dry  Heart: regular rate and rhythm  Lungs: no wheezing or rales heard      Initiation of care has begun. The patient has been counseled on the process, plan, and necessity for staying for the completion/evaluation, and the remainder of the medical screening examination    Ashley Murrain, NP 06/22/18 1623    Carmin Muskrat, MD 06/25/18 [phone]

## 2018-06-22 NOTE — ED Notes (Addendum)
Patient given PO fluids and crackers. Tolerating well.

## 2018-06-22 NOTE — Discharge Instructions (Signed)

## 2018-06-22 NOTE — ED Notes (Signed)
Patient verbalizes understanding of medications and discharge instructions. No further questions at this time. VSS and patient ambulatory at discharge.   

## 2018-07-04 ENCOUNTER — Telehealth: Payer: Self-pay | Admitting: Cardiology

## 2018-07-04 NOTE — Telephone Encounter (Signed)
New Message   Pt is calling to check on the results to her CT scan and to also inform Dr. Ellyn Hack that she is having a Hydrogen breast test tomorrow

## 2018-07-04 NOTE — Telephone Encounter (Signed)
-----   Message from Leonie Man, MD sent at 07/03/2018  2:09 PM EDT ----- Good news - CT FFR did not show any evidence of physiologically significant CAD. However, there is evidence of at least moderate amount of atherosclerotic disease in the Mid LAD -- this would mean that we need to be more aggressive overall on treating Cardiac Risk Factors -- cholesterol, BP & blood sugar control, etc.  Glenetta Hew, MD  pls fwd to PCP: Octavio Graves, DO

## 2018-07-04 NOTE — Telephone Encounter (Signed)
Advised patient of results.  

## 2018-08-30 ENCOUNTER — Ambulatory Visit: Payer: Medicare HMO | Admitting: Cardiology

## 2018-08-30 VITALS — BP 138/90 | HR 89 | Ht 60.0 in | Wt 175.8 lb

## 2018-08-30 DIAGNOSIS — E1169 Type 2 diabetes mellitus with other specified complication: Secondary | ICD-10-CM | POA: Diagnosis not present

## 2018-08-30 DIAGNOSIS — E785 Hyperlipidemia, unspecified: Secondary | ICD-10-CM | POA: Diagnosis not present

## 2018-08-30 DIAGNOSIS — I2510 Atherosclerotic heart disease of native coronary artery without angina pectoris: Secondary | ICD-10-CM

## 2018-08-30 DIAGNOSIS — R9431 Abnormal electrocardiogram [ECG] [EKG]: Secondary | ICD-10-CM | POA: Diagnosis not present

## 2018-08-30 NOTE — Patient Instructions (Signed)
Medication Instructions:  Not needed If you need a refill on your cardiac medications before your next appointment, please call your pharmacy.   Lab work: Not needed If you have labs (blood work) drawn today and your tests are completely normal, you will receive your results only by: . MyChart Message (if you have MyChart) OR . A paper copy in the mail If you have any lab test that is abnormal or we need to change your treatment, we will call you to review the results.  Testing/Procedures: Not needed  Follow-Up: At [facility], you and your health needs are our priority.  As part of our continuing mission to provide you with exceptional heart care, we have created designated Provider Care Teams.  These Care Teams include your primary Cardiologist (physician) and Advanced Practice Providers (APPs -  Physician Assistants and Nurse Practitioners) who all work together to provide you with the care you need, when you need it. You will need a follow up appointment in 12 months.  Please call our office 2 months in advance to schedule this appointment.  You may see David Harding, MD or one of the following Advanced Practice Providers on your designated Care Team:   Rhonda Barrett, PA-C . Kathryn Lawrence, DNP, ANP  Any Other Special Instructions Will Be Listed Below (If Applicable).   

## 2018-08-30 NOTE — Progress Notes (Deleted)
PCP: Octavio Graves, DO  Clinic Note: No chief complaint on file.   HPI: Brandy Cooke is a 52 y.o. female who is being seen today for the evaluation of CHEST PAIN at the request of  Octavio Graves, DO in response to an emergency room visit on April 29, 2018. Brandy Cooke has a long-standing history of diabetes mellitus, type II (with gastroparesis and neuropathy), hypertension, and hyperlipidemia.  She is not currently on any blood pressure medicines, and is only on low-dose Mevacor for hyperlipidemia with relatively good control. She has a history of reported cardiac arrest associated with intubation for surgery back in October 2016.  Brandy Cooke was last seen by an internal medicine physician back in January 2019.  Apparently she has been dealing with a lot of epigastric and GI issues probably related to gastroparesis.  She is however been evaluated with a PE protocol CT scan in January and June of this year.  There is evidence of coronary calcification noted on CT scan.  Recent Hospitalizations:  April 29, 2018: Presented with sharp epigastric abdominal pain apparently has been going on for several months.  She felt as it was a "tear in the right side of her abdomen ".  (To me she points more left-sided).  She said that the discomfort radiated to the substernal chest area.  She also noted alternating diarrhea and constipation is a chronic symptom.  Back in January she was told that she had a right-sided diaphragmatic hernia (was very scared because she was told that if it pushes against her heart it could cause sudden death).  On the day of presentation, she had been more active than usual. -->  She was diagnosed with epigastric pain related to constipation and pleurodynia.  Studies Per;sonally Reviewed - (if available, images/films reviewed: From Epic Chart or Care Everywhere)  Cor CTA 8/26: ~50-69% mLAD - FFR Negative.  RCA & LCx 25-50%.   Interval History: Brandy Cooke presents here today  the notably in if not physical distress and emotional distress.  She is very tearful and concerned.  She tells me she has had 2 episodes of chest pain that are concerning for her. 1. While riding in her car she had an episode of 35 minutes of pain (couple weeks ago).  She went to the fire department and was told she had normal EKG and her symptoms improved somewhat so she did not go to emergency room. 2. She was at the store near her house talking to friends and she started having sharp more epigastric type pain radiating up into her chest.  That is what led to her going to the emergency room and having the evaluation noted above. She tells me that the chest pain episodes have happened with both rest or with doing mild exertion.  Not with fast walking.  She has had several episodes of feeling dizzy but cannot really describe to me symptoms that go along with it.  She really cannot tell me if she feels any irregular heartbeats or flip-flopping skipping beats.  She thinks she might but not all the time. As far as the chest pain episodes that she has been having now they do not necessarily seem to be exertional although taking a deep breath and her coughing makes them worse.  She points to her epigastric area and saying that it goes up into her chest.  She also has diffuse abdominal discomfort.  Very hard to get any good story from her.  She says  that she walks her dog daily and does not have worsening of this chest discomfort walking the dog.  May affect she may always have a little bit of discomfort in her chest but the symptoms that she had noted above were different for her.  With her walking, she denies any chest discomfort.  She sometimes gets short of breath with exercise, but not routinely.  She does not really do much exercise, besides walking her her dog around the neighborhood.  no real complaints of PND, orthopnea or edema.  She feels fluttering but no rapid irregular heartbeats or palpitations.  No  syncope/near syncope or TIA/amaurosis fugax.  No claudication.  ROS: A comprehensive was performed and to the extent was performed was positive. Review of Systems  Constitutional: Positive for chills and malaise/fatigue. Negative for diaphoresis.  HENT: Negative for congestion, nosebleeds and sore throat.   Eyes: Positive for blurred vision.  Respiratory: Positive for cough and shortness of breath. Negative for wheezing.   Cardiovascular: Negative for leg swelling (Minimal).  Gastrointestinal: Positive for abdominal pain, constipation, diarrhea, heartburn and nausea. Negative for blood in stool and melena.  Genitourinary: Negative for hematuria.  Musculoskeletal: Positive for back pain, joint pain (Knees and hips) and myalgias (Fibromyalgia). Negative for falls.  Neurological: Positive for dizziness, weakness (Global) and headaches.  Psychiatric/Behavioral: Positive for depression (Supposedly under control). The patient is nervous/anxious.   All other systems reviewed and are negative.    I have reviewed and (if needed) personally updated the patient's problem list, medications, allergies, past medical and surgical history, social and family history.   Past Medical History:  Diagnosis Date  . Allergic rhinitis   . Asthma   . Chronic depression    Dr. Annette Stable [facility]iat[facility]     . Chronic pain of multiple sites   . DDD (degenerative disc disease), lumbar   . Diabetic gastroparesis associated with type 2 diabetes mellitus ([place])   . Dyslipidemia    Well-controlled with low-dose Mevacor  . Fibromyalgia   . Ganglion cyst of dorsum of left wrist   . Glaucoma   . Headaches due to old head injury   . IBS (irritable bowel syndrome)   . Impingement syndrome of left shoulder   . Macromastia   . Migraine   . Nephrolithiasis   . Postoperative hypoxia   . Skin cancer   . Sleep apnea   . Type 2 diabetes mellitus without complication, without long-term current use of insulin ([place])   . Vitamin  B12 deficiency   --Gastroparesis from diabetes  Past Surgical History:  Procedure Laterality Date  . BREAST REDUCTION SURGERY    . CHOLECYSTECTOMY    . ELBOW SURGERY     X4  . GANGLION CYST EXCISION Left    WRIST  . ORIF ANKLE FRACTURE Right   . ROTATOR CUFF REPAIR     RIGHT AND LEFT    Current Meds  Medication Sig  . acyclovir (ZOVIRAX) 400 MG tablet Take 400 mg by mouth 2 (two) times daily.  Marland Kitchen albuterol (VENTOLIN HFA) 108 (90 Base) MCG/ACT inhaler Inhale 1-2 puffs into the lungs every 6 (six) hours as needed for wheezing or shortness of breath.  . ALPRAZolam (XANAX) 1 MG tablet Take 1-3 mg by mouth See admin instructions. Take 3 mg by mouth at bedtime and also 1 mg once a day as needed for anxiety  . [place]ona alk-PHENObarbital (PHENOHYTRO) 16.2 MG tablet Take 1 tablet by mouth See admin instructions. Take 16.2 mg by mouth  two times a day- morning and bedtime and also 2 more times a day as needed for stomach pain  . cyclobenzaprine (FLEXERIL) 10 MG tablet Take 10 mg by mouth See admin instructions. Take 10 mg by mouth at bedtime and also once during the day as needed for muscle spasms  . glimepiride (AMARYL) 2 MG tablet Take 2 mg by mouth 2 (two) times daily.   . hydrochlorothiazide (HYDRODIURIL) 12.5 MG tablet Take 12.5 mg by mouth daily.  . hyoscyamine (LEVSIN SL) 0.125 MG SL tablet Place 0.125 mg under the tongue every 4 (four) hours as needed for cramping.  . lovastatin (MEVACOR) 20 MG tablet Take 20 mg by mouth daily with supper.   . metFORMIN (GLUCOPHAGE-XR) 500 MG 24 hr tablet Take 1,000 mg by mouth 2 (two) times daily.   Marland Kitchen omeprazole (PRILOSEC) 40 MG capsule Take 40 mg by mouth 2 (two) times daily.  Marland Kitchen Plecanatide 3 MG TABS Take 1 tablet by mouth daily.  . traZODone (DESYREL) 100 MG tablet Take 200 mg by mouth at bedtime.  . triamcinolone cream (KENALOG) 0.1 % Apply 1 application topically 2 (two) times daily as needed (for itching).     Allergies  Allergen Reactions  .  Depakote [Divalproex Sodium] Anaphylaxis, Shortness Of Breath and Swelling    Throat swells  . Gabapentin Anaphylaxis, Shortness Of Breath, Itching, Swelling and Other (See Comments)    Will make the patient "STOP BREATHING" (Throat will swell shut)  . Penicillins Anaphylaxis, Shortness Of Breath and Swelling    Throat swells Has patient had a PCN reaction causing immediate rash, facial/tongue/throat swelling, SOB or lightheadedness with hypotension: Yes Has patient had a PCN reaction causing severe rash involving mucus membranes or skin necrosis: Unk Has patient had a PCN reaction that required hospitalization: Unk Has patient had a PCN reaction occurring within the last 10 years: No If all of the above answers are "NO", then may proceed with Cephalosporin use.   . Sulfa Antibiotics Rash and Itching  . Poison [employer]    Social History   Tobacco Use  . Smoking status: Never Smoker  . Smokeless tobacco: Never Used  Substance Use Topics  . Alcohol use: Never    Frequency: Never  . Drug use: Never   Social History   Social History Narrative  . Not on file    family history includes Diabetes Mellitus I in her sister; Diabetes Mellitus II (age of onset: 20) in her father.  Wt Readings from Last 3 Encounters:  08/30/18 175 lb 12.8 oz (79.7 kg)  06/22/18 181 lb (82.1 kg)  05/25/18 182 lb (82.6 kg)    PHYSICAL EXAM BP 138/90   Pulse 89   Ht 5' (1.524 m)   Wt 175 lb 12.8 oz (79.7 kg)   SpO2 93%   BMI 34.33 kg/m  Physical Exam  Constitutional: She is oriented to person, place, and time. She appears well-developed and well-nourished. She appears distressed (More emotional than physical).  Well-nourished well-groomed.  Moderately obese.  Seems to be in emotional distress more so than anything else.  Nontoxic.  HENT:  Head: Normocephalic and atraumatic.  Mouth/Throat: No oropharyngeal exudate.  Tearful  Eyes: Pupils are equal, round, and reactive to light.  Conjunctivae and EOM are normal. No scleral icterus.  Neck: Normal range of motion. Neck supple. No hepatojugular reflux and no JVD present. Carotid bruit is not present.  Cardiovascular: Normal rate, regular rhythm, intact distal pulses and normal pulses.  Extrasystoles  are present. PMI is not displaced (Unable to palpate). Exam reveals distant heart sounds. Exam reveals no gallop and no friction rub.  No murmur heard. Pulmonary/Chest: No respiratory distress. She has no wheezes. She has no rales. She exhibits tenderness (Tenderness along the lower sternal border into the epigastrium.).  Diminished basal breath sounds.  Possibly due to poor effort.  Abdominal: She exhibits distension. She exhibits no mass. There is tenderness. There is guarding.  Difficult to tell if there is true distention or just fullness.  Does appear to be mildly distended and tympanitic.  Exquisite tenderness in the epigastric region radiating from right upper quadrant to left upper quadrant.  No positive Murphy or McBurney sign.  No rebound. She was in tears when I was examining her abdomen  Musculoskeletal: Normal range of motion. She exhibits edema (Trivial bilateral pedal).  Lymphadenopathy:    She has no cervical adenopathy.  Neurological: She is alert and oriented to person, place, and time. No cranial nerve deficit. Coordination normal.  Skin: Skin is warm and dry.  Psychiatric: Her speech is normal. Thought content normal. Her mood appears anxious. Her affect is labile. She is slowed. She exhibits a depressed mood. She exhibits abnormal recent memory.  Very labile, tearful especially after physical exam complete.  Vitals reviewed.   Adult ECG Report  Rate: 89;  Rhythm: normal sinus rhythm and Cannot exclude inferior and anterior MI age undetermined.  Normal axis, intervals and durations.;   Narrative Interpretation: Besides poor R wave progression and borderline cues in inferior leads, normal.   Other studies  Reviewed: Additional studies/ records that were reviewed today include:  Recent Labs: From Care Everywhere  January 2019 A1c 7.9  April 29, 2018: CBC-W 10.2, H/H 12.5/39.5, platelet 259. Na+ 137, K+ 3.7, Cl- 98, HCO3- 26, BUN 8, Cr 0.6, Glu 91, Ca++ 8.9; AST  17, ALT 22, AlkP 91  February 28, 2018: TSH 2.3, free T4 1.04.  August 30, 2017: TC 159, TG 324, HDL 29, LDL 65   ASSESSMENT / PLAN: Problem List Items Addressed This Visit    None       I spent a total of 19mnutes with the patient and chart review. >  50% of the time was spent in direct patient consultation.   Current medicines are reviewed at length with the patient today.  (+/- concerns) not sure what to about her Sx. The following changes have been made:  see below, no med changes pending OP evaluation.  Patient Instructions  Medication Instructions:  Your physician recommends that you continue on your current medications as directed. Please refer to the Current Medication list given to you today.   Labwork: none  Testing/Procedures: See instructions below.   Follow-Up: Your physician recommends that you schedule a follow-up appointment in: 3 months with Dr. HEllyn Hack    Studies Ordered:   No orders of the defined types were placed in this encounter.     DGlenetta Hew M.D., M.S. Interventional Cardiologist   Pager # [phone] # [phone] [address] [place]  [id number]  [facility]!

## 2018-09-05 ENCOUNTER — Encounter: Payer: Self-pay | Admitting: Cardiology

## 2018-09-05 NOTE — Assessment & Plan Note (Signed)
Goal LDL should be less than 100 and closer to 70 now based on findings of moderate CAD. Low threshold to titrate to a more potent statin

## 2018-09-05 NOTE — Assessment & Plan Note (Signed)
Moderate diffuse coronary artery disease, but no lesions to explain her chest pain unless spasm related. Plan: With definite evidence of CAD, would recommend continued aggressive lipid control (have been okay with lovastatin), glycemic control and blood pressure control. Would preferentially use ACE inhibitor/ARB or beta-blocker for blood pressure.

## 2018-09-05 NOTE — Assessment & Plan Note (Signed)
CTA findings would not suggest prior inferior anterior MI.  These are probably just artifactual findings.

## 2018-09-05 NOTE — Progress Notes (Signed)
PCP: Octavio Graves, DO  Clinic Note: Chief Complaint  Patient presents with  . Follow-up    Chest pain evaluation with coronary CTA    HPI: Brandy Cooke is a 52 y.o. female with a PMH below who presents today for basically 52-monthfollow-up after initial evaluation for chest pain at the request of BOctavio Graves DO after an ER visit for CP.   KMalachihas a long-standing history of diabetes mellitus, type II (with gastroparesis and neuropathy), hypertension, and hyperlipidemia along with IBS.  She is currently only on HCTZ and low-dose for blood pressure and on low-dose Mevacor for hyperlipidemia with relatively good control.  She is on metformin and sulfonylurea for glycemic control. She has a history of reported cardiac arrest associated with intubation for surgery back in October 2016  KElainaLAmberleigh Gerkenwas last seen on May 25, 2018 --> having lots of physical and emotional distress.  Very tearful.  Describe 2 different episodes of chest pain:  1. 35 minutes of pain while riding in a car.  Was told had an abnormal EKG at fire department. 2. Talking to friends while at a store, noted sharp epigastric pain radiating up to the chest -> went to ER. --She also noted diffuse abdominal discomfort somewhat radiating to the chest.  Walks her dog regularly with no worsening of symptoms. -->  Because of coronary artery calcification noted on PE protocol chest CT, referred for Cardiac CT Angiogram  Recent Hospitalizations:   ER visit June 22, 2018 for precordial pain --> had chest pain while having CT scan done.  She had lightheadedness and was orthostatic after her CT scan despite having 1 L fluid.  She was sent to the emergency room.  Studies Personally Reviewed - (if available, images/films reviewed: From Epic Chart or Care Everywhere)  06/22/2018 Cardiac CTA with CT FFR: Coronary calcium score 210.  Right dominant.  Mild to moderate diffuse CAD (RCA 25 and 50%, mid LAD 50-69%, LCx  25-50%).  CT FFR showed no significant stenosis in LAD, LCx or RCA.  Normal PA.  Normal pulmonary veins.  Interval History: KLaliareturns today here for follow-up.  She notes that she is only had few episodes of chest pain since her CT scan.  These seem to have gotten better since she changed her GI medications.  She was quite concerned about her doctor oxygen levels and having dizziness after CT scan but that resolved.  She apparently had recent EGD that showed gastritis and possible fatty liver.  She really has noted that she has not had a good appetite and not wanting to eat anything ever since this evaluation.  She really denies any exertional chest tightness or pressure complaining more of right shoulder and right wrist pain.  She also complains about poor vision with very poor depth perception leading to her being quite dizzy and poor balance.  She denies any headache or blurred vision.  Just the poor depth perception.  No PND orthopnea or edema.  No real exertional dyspnea. No rapid heartbeats palpitations.  No syncope/near syncope or TIA/amaurosis fugax symptoms. No claudication.  ROS: A comprehensive was performed. Review of Systems  Constitutional: Positive for malaise/fatigue and weight loss (Not eating well).  HENT: Negative for congestion and nosebleeds.   Eyes: Positive for double vision (Poor depth perception).  Respiratory: Negative for cough and shortness of breath.   Gastrointestinal: Positive for abdominal pain, heartburn and nausea. Negative for blood in stool, constipation and melena.  Genitourinary: Negative  for dysuria and hematuria.  Musculoskeletal: Positive for joint pain (Right shoulder and right wrist). Negative for myalgias.  Neurological: Positive for dizziness. Negative for speech change and focal weakness.  Psychiatric/Behavioral: Positive for depression. Negative for memory loss. The patient is nervous/anxious. The patient does not have insomnia.   All other  systems reviewed and are negative.   I have reviewed and (if needed) personally updated the patient's problem list, medications, allergies, past medical and surgical history, social and family history.   Past Medical History:  Diagnosis Date  . Allergic rhinitis   . Asthma   . Chronic depression    Dr. Annette Stable Psychiatry     . Chronic pain of multiple sites   . Coronary artery disease, non-occlusive 06/2018   Cardiac CTA-FFR:  Coronary calcium score 210.  Right dominant.  Mild to moderate diffuse CAD (RCA 25 and 50%, mid LAD 50-69%, LCx 25-50%).  CT FFR showed no significant stenosis in LAD, LCx or RCA.  Normal PA.  Normal pulmonary veins.  . DDD (degenerative disc disease), lumbar   . Diabetic gastroparesis associated with type 2 diabetes mellitus ([place])   . Dyslipidemia    Well-controlled with low-dose Mevacor  . Fibromyalgia   . Ganglion cyst of dorsum of left wrist   . Glaucoma   . Headaches due to old head injury   . IBS (irritable bowel syndrome)   . Impingement syndrome of left shoulder   . Macromastia   . Migraine   . Nephrolithiasis   . Postoperative hypoxia   . Skin cancer   . Sleep apnea   . Type 2 diabetes mellitus without complication, without long-term current use of insulin ([place])   . Vitamin B12 deficiency     Past Surgical History:  Procedure Laterality Date  . BREAST REDUCTION SURGERY    . CHOLECYSTECTOMY    . ELBOW SURGERY     X4  . GANGLION CYST EXCISION Left    WRIST  . ORIF ANKLE FRACTURE Right   . ROTATOR CUFF REPAIR     RIGHT AND LEFT    Current Meds  Medication Sig  . acyclovir (ZOVIRAX) 400 MG tablet Take 400 mg by mouth 2 (two) times daily.  Marland Kitchen albuterol (VENTOLIN HFA) 108 (90 Base) MCG/ACT inhaler Inhale 1-2 puffs into the lungs every 6 (six) hours as needed for wheezing or shortness of breath.  . ALPRAZolam (XANAX) 1 MG tablet Take 1-3 mg by mouth See admin instructions. Take 3 mg by mouth at bedtime and also 1 mg once a day as needed for  anxiety  . belladona alk-PHENObarbital (PHENOHYTRO) 16.2 MG tablet Take 1 tablet by mouth See admin instructions. Take 16.2 mg by mouth two times a day- morning and bedtime and also 2 more times a day as needed for stomach pain  . cyclobenzaprine (FLEXERIL) 10 MG tablet Take 10 mg by mouth See admin instructions. Take 10 mg by mouth at bedtime and also once during the day as needed for muscle spasms  . glimepiride (AMARYL) 2 MG tablet Take 2 mg by mouth 2 (two) times daily.   . hydrochlorothiazide (HYDRODIURIL) 12.5 MG tablet Take 12.5 mg by mouth daily.  . hyoscyamine (LEVSIN SL) 0.125 MG SL tablet Place 0.125 mg under the tongue every 4 (four) hours as needed for cramping.  . lovastatin (MEVACOR) 20 MG tablet Take 20 mg by mouth daily with supper.   . metFORMIN (GLUCOPHAGE-XR) 500 MG 24 hr tablet Take 1,000 mg by mouth 2 (two)  times daily.   Marland Kitchen omeprazole (PRILOSEC) 40 MG capsule Take 40 mg by mouth 2 (two) times daily.  Marland Kitchen Plecanatide 3 MG TABS Take 1 tablet by mouth daily.  . traZODone (DESYREL) 100 MG tablet Take 200 mg by mouth at bedtime.  . triamcinolone cream (KENALOG) 0.1 % Apply 1 application topically 2 (two) times daily as needed (for itching).     Allergies  Allergen Reactions  . Depakote [Divalproex Sodium] Anaphylaxis, Shortness Of Breath and Swelling    Throat swells  . Gabapentin Anaphylaxis, Shortness Of Breath, Itching, Swelling and Other (See Comments)    Will make the patient "STOP BREATHING" (Throat will swell shut)  . Penicillins Anaphylaxis, Shortness Of Breath and Swelling    Throat swells Has patient had a PCN reaction causing immediate rash, facial/tongue/throat swelling, SOB or lightheadedness with hypotension: Yes Has patient had a PCN reaction causing severe rash involving mucus membranes or skin necrosis: Unk Has patient had a PCN reaction that required hospitalization: Unk Has patient had a PCN reaction occurring within the last 10 years: No If all of the  above answers are "NO", then may proceed with Cephalosporin use.   . Sulfa Antibiotics Rash and Itching  . Poison [employer]    Social History   Tobacco Use  . Smoking status: Never Smoker  . Smokeless tobacco: Never Used  Substance Use Topics  . Alcohol use: Never    Frequency: Never  . Drug use: Never   Social History   Social History Narrative  . Not on file    family history includes Diabetes Mellitus I in her sister; Diabetes Mellitus II (age of onset: 11) in her father.  Wt Readings from Last 3 Encounters:  08/30/18 175 lb 12.8 oz (79.7 kg)  06/22/18 181 lb (82.1 kg)  05/25/18 182 lb (82.6 kg)    PHYSICAL EXAM BP 138/90   Pulse 89   Ht 5' (1.524 m)   Wt 175 lb 12.8 oz (79.7 kg)   SpO2 93%   BMI 34.33 kg/m  Physical Exam  Constitutional: She is oriented to person, place, and time. She appears well-developed and well-nourished. No distress.  HENT:  Head: Normocephalic and atraumatic.  Neck: Normal range of motion. Neck supple. No hepatojugular reflux and no JVD present. Carotid bruit is not present.  Cardiovascular: Normal rate, regular rhythm, S1 normal, S2 normal and normal pulses.  Occasional extrasystoles (Rare) are present. PMI is not displaced (Difficult to palpate). Exam reveals distant heart sounds. Exam reveals no gallop and no friction rub.  No murmur heard. Pulmonary/Chest: Effort normal and breath sounds normal. No respiratory distress. She has no wheezes. She has no rales.  Musculoskeletal: Normal range of motion. She exhibits no edema (Trivial bilateral pedal).  Neurological: She is alert and oriented to person, place, and time.  Psychiatric: Her behavior is normal. Judgment and thought content normal.  Much less anxious mood.  Slowed somewhat depressed affect.  Vitals reviewed.   Adult ECG Report n/a  Other studies Reviewed: Additional studies/ records that were reviewed today include:  Recent Labs:  n/a No results found for: CHOL,  HDL, LDLCALC, LDLDIRECT, TRIG, CHOLHDL  ASSESSMENT / PLAN: Problem List Items Addressed This Visit    Abnormal EKG    CTA findings would not suggest prior inferior anterior MI.  These are probably just artifactual findings.      Coronary artery disease, non-occlusive - Primary (Chronic)    Moderate diffuse coronary artery disease, but no lesions to  explain her chest pain unless spasm related. Plan: With definite evidence of CAD, would recommend continued aggressive lipid control (have been okay with lovastatin), glycemic control and blood pressure control. Would preferentially use ACE inhibitor/ARB or beta-blocker for blood pressure.      Hyperlipidemia associated with type 2 diabetes mellitus (HCC) (Chronic)    Goal LDL should be less than 100 and closer to 70 now based on findings of moderate CAD. Low threshold to titrate to a more potent statin         Current medicines are reviewed at length with the patient today.  (+/- concerns) n/a The following changes have been made:  n/a  Patient Instructions  Medication Instructions:  Not needed If you need a refill on your cardiac medications before your next appointment, please call your pharmacy.   Lab work: Not needed If you have labs (blood work) drawn today and your tests are completely normal, you will receive your results only by: Marland Kitchen MyChart Message (if you have MyChart) OR . A paper copy in the mail If you have any lab test that is abnormal or we need to change your treatment, we will call you to review the results.  Testing/Procedures: Not needed  Follow-Up: At [facility], you and your health needs are our priority.  As part of our continuing mission to provide you with exceptional heart care, we have created designated Provider Care Teams.  These Care Teams include your primary Cardiologist (physician) and Advanced Practice Providers (APPs -  Physician Assistants and Nurse Practitioners) who all work together to  provide you with the care you need, when you need it. You will need a follow up appointment in 12 months.  Please call our office 2 months in advance to schedule this appointment.  You may see Glenetta Hew, MD or one of the following Advanced Practice Providers on your designated Care Team:   Rosaria Ferries, PA-C . Jory Sims, DNP, ANP  Any Other Special Instructions Will Be Listed Below (If Applicable).     Studies Ordered:   No orders of the defined types were placed in this encounter.     Glenetta Hew, M.D., M.S. Interventional Cardiologist   Pager # [phone] Phone # [phone] [address]. [place], [place] [address]   Thank you for choosing [facility] at [facility]!!

## 2018-10-27 IMAGING — CT CT HEART MORP W/ CTA COR W/ SCORE W/ CA W/CM &/OR W/O CM
4 of 7 series · 8 of 20 positions shown, 9 images · IV contrast (APPLIED)
Comparison: None.

CLINICAL DATA: 52-year-old female with DM, hyperlipidemia, chest
pain and abnormal ECG.

EXAM:
Cardiac/Coronary  CT
TECHNIQUE: The patient was scanned on a [employer] Force scanner.

[Series 6: best diast 73 % · axial · 0.32mm/px · z∈[+1216,+1255]mm · 2 of 291 slices shown, 3 images]
[im 97/291  vessel]
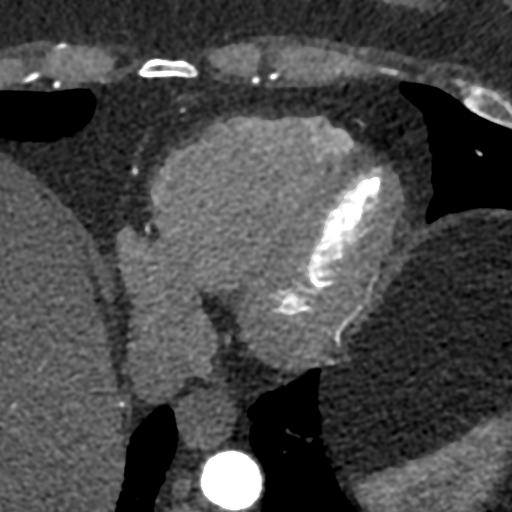
[im 97/291  lung]
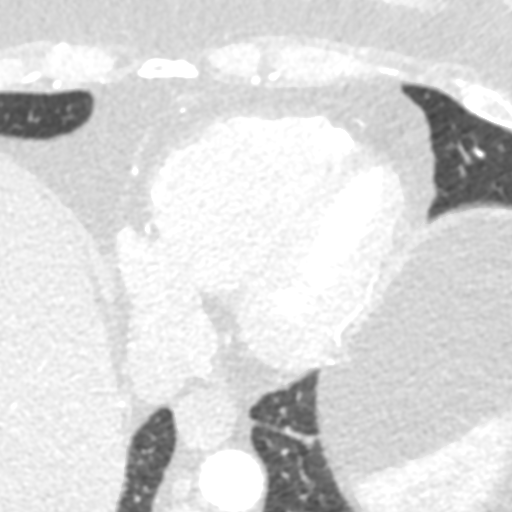
[im 194/291  vessel]
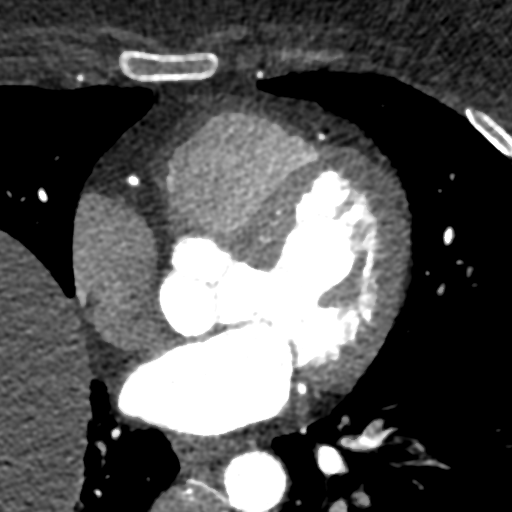

[Series 7: best syst 39 % · axial · 0.32mm/px · z∈[+1216,+1255]mm · 2 of 291 slices shown]
[im 97/291  vessel]
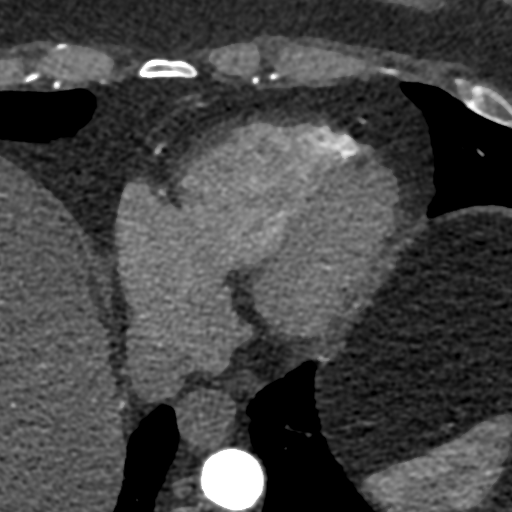
[im 194/291  vessel]
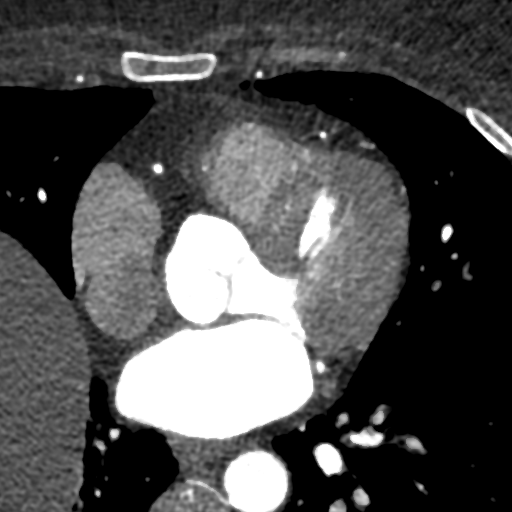

[Series 8: ts diast sharp 73 % · axial · 0.32mm/px · z∈[+1216,+1255]mm · 2 of 291 slices shown]
[im 97/291  lung]
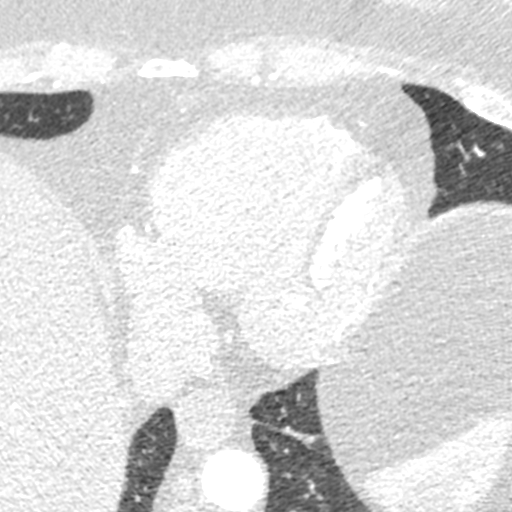
[im 194/291  lung]
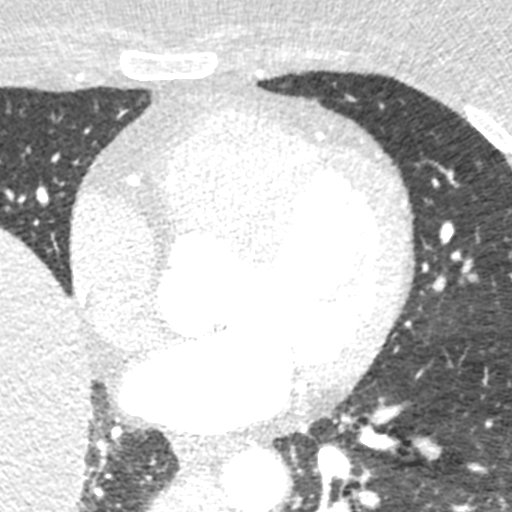

[Series 9: ts syst sharp 39 % · axial · 0.32mm/px · z∈[+1216,+1255]mm · 2 of 291 slices shown]
[im 97/291  lung]
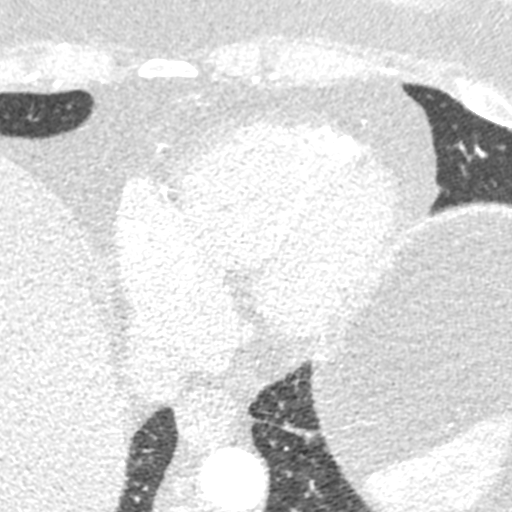
[im 194/291  lung]
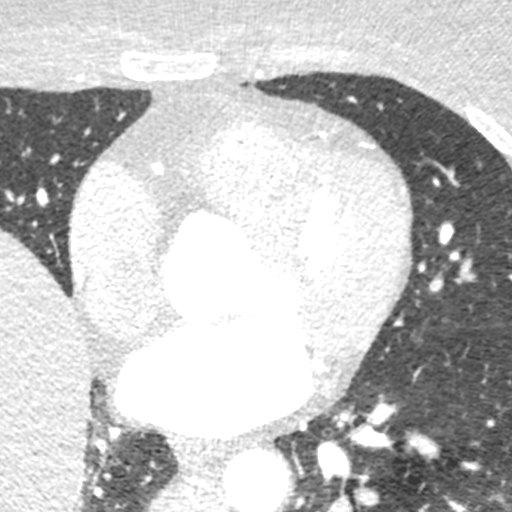

[8 of 20 positions shown; findings below may reference images not displayed]



Aorta:  Normal size.  Mild calcifications.  No dissection.

Aortic Valve: Trileaflet.  Trivial calcifications.

Coronary Arteries:  Normal coronary origin.  Right dominance.

RCA is a small lumen dominant artery that gives rise to small PDA
and PLVB. There is mild calcified plaque in the proximal RCA with
associated stenosis 25-50%.

Left main is a large artery that gives rise to LAD and LCX arteries.
Left main has no stenosis.

LAD is a large vessel that gives rise to one diagonal artery. There
is moderate calcified plaque in the mid LAD with a focal stenosis
50-69%.

D1 has no significant plaque.

LCX is a non-dominant artery that gives rise to two OM branches.
There is mild calcified plaque in the proximal segment with stenosis
25-50%.

Other findings:

Normal pulmonary vein drainage into the left atrium.

Normal let atrial appendage without a thrombus.

Normal size of the pulmonary artery.
IMPRESSION: 1. Coronary calcium score of 210. This was 99 percentile for age and
sex matched control.

2. Normal coronary origin with right dominance.

3. Mild to moderate diffuse CAD. CT FFR will be submitted for
further evaluation.

EXAM:
OVER-READ INTERPRETATION  CT CHEST

The following report is an over-read performed by radiologist Dr.
Klinike Dentare Blana [[email or web address]] on 06/22/2018. This over-read
does not include interpretation of cardiac or coronary anatomy or
pathology. The coronary CTA interpretation by the cardiologist is
attached.
FINDINGS: Vascular: Normal aortic caliber, without dissection. No imaged
pulmonary embolism, on this nondedicated study.

Mediastinum/Nodes: No imaged thoracic adenopathy.

Lungs/Pleura: Right hemidiaphragm elevation. No pleural fluid. Right
lower lobe volume loss and subsegmental atelectasis or scar.

Upper Abdomen: Moderate hepatic steatosis. Normal imaged portions of
the spleen, stomach.

Musculoskeletal: No acute osseous abnormality.
IMPRESSION: 1.  No acute findings in the imaged extracardiac chest.
2. Right hemidiaphragm elevation.
3. Hepatic steatosis.

## 2019-07-11 DEATH — deceased
